# Patient Record
Sex: Male | Born: 1990 | Race: Black or African American | Hispanic: No | Marital: Single | Smoking: Current every day smoker
Health system: Southern US, Community
[De-identification: ages and names within clinical notes are randomized; demographics above are authoritative.]

---
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
## 2017-10-21 ENCOUNTER — Encounter: Payer: Self-pay | Admitting: Emergency Medicine

## 2017-10-21 ENCOUNTER — Other Ambulatory Visit: Payer: Self-pay

## 2017-10-21 ENCOUNTER — Emergency Department
Admission: EM | Admit: 2017-10-21 | Discharge: 2017-10-21 | Disposition: A | Discharge status: Home / Self Care | Payer: Self-pay | Attending: Emergency Medicine | Admitting: Emergency Medicine

## 2017-10-21 DIAGNOSIS — R220 Localized swelling, mass and lump, head: Secondary | ICD-10-CM | POA: Insufficient documentation

## 2017-10-21 DIAGNOSIS — K029 Dental caries, unspecified: Secondary | ICD-10-CM | POA: Insufficient documentation

## 2017-10-21 DIAGNOSIS — K047 Periapical abscess without sinus: Secondary | ICD-10-CM | POA: Insufficient documentation

## 2017-10-21 DIAGNOSIS — F17210 Nicotine dependence, cigarettes, uncomplicated: Secondary | ICD-10-CM | POA: Insufficient documentation

## 2017-10-21 MED ORDER — IBUPROFEN 800 MG PO TABS
800.0000 mg | ORAL_TABLET | Freq: Once | ORAL | Status: AC
  Administered 2017-10-21: 800 mg via ORAL
  Filled 2017-10-21: qty 1

## 2017-10-21 MED ORDER — LIDOCAINE VISCOUS 2 % MT SOLN
15.0000 mL | Freq: Once | OROMUCOSAL | Status: AC
  Administered 2017-10-21: 15 mL via OROMUCOSAL
  Filled 2017-10-21: qty 15

## 2017-10-21 MED ORDER — TRAMADOL HCL 50 MG PO TABS: 50.0000 mg | ORAL_TABLET | Freq: Four times a day (QID) | ORAL | 0 refills | Status: AC | PRN

## 2017-10-21 MED ORDER — TRAMADOL HCL 50 MG PO TABS
50.0000 mg | ORAL_TABLET | Freq: Once | ORAL | Status: AC
  Administered 2017-10-21: 50 mg via ORAL
  Filled 2017-10-21: qty 1

## 2017-10-21 MED ORDER — IBUPROFEN 600 MG PO TABS: 600.0000 mg | ORAL_TABLET | Freq: Four times a day (QID) | ORAL | 0 refills | Status: AC | PRN

## 2017-10-21 MED ORDER — AMOXICILLIN 500 MG PO CAPS: 500.0000 mg | ORAL_CAPSULE | Freq: Three times a day (TID) | ORAL | 0 refills | Status: AC

## 2017-10-21 NOTE — ED Notes (Signed)
Pt presents today for oral pain. Pt states jaw and face swelling. Slight swelling is noticed. Pt states pain 2 weeks ago off and on and swelling started yesterday. Pt denies fever n/v/d. Pt is NAD and awaiting EDP.

## 2017-10-21 NOTE — ED Notes (Signed)
First Nurse Note:  Facial swelling on left side related to toothache.

## 2017-10-21 NOTE — Discharge Instructions (Addendum)
Follow-up from list of dental clinics. °OPTIONS FOR DENTAL FOLLOW UP CARE ° °[place] [facility] and Human Services - Local Safety Net Dental Clinics °http://www.ncdhhs.gov/dph/oralhealth/services/safetynetclinics.htm °  °[facility] (336-562-3123) ° °[facility] (919-933-9087) ° °[facility] (919-663-1744 ext [phone]) ° °[facility] ([phone]) ° °[facility] ([phone]) °This clinic caters to the indigent population and is on a lottery system. °Location: °[facility], [address], [place] °Clinic Hours: °Wednesdays from 6pm - 9pm, patients seen by a lottery system. °For dates, call or go to www.med.[facility].edu/[facility]/patients/Dental-[facility] °Services: °Cleanings, fillings and simple extractions. °Payment Options: °DENTAL WORK IS FREE OF CHARGE. Bring proof of income or support. °Best way to get seen: °Arrive at 5:15 pm - this is a lottery, NOT first come/first serve, so arriving earlier will not increase your chances of being seen. °  °  °[facility] °[phone] °Select option 1 for emergencies °  °Location: °[facility], [address], [place] °Clinic Hours: °No walk-ins accepted - call the day before to schedule an appointment. °Check in times are 9:30 am and 1:30 pm. °Services: °Simple extractions, temporary fillings, pulpectomy/pulp debridement, uncomplicated abscess drainage. °Payment Options: °PAYMENT IS DUE AT THE TIME OF SERVICE.  Fee is usually $100-200, additional surgical procedures (e.g. abscess drainage) may be extra. °Cash, checks, Visa/MasterCard accepted.  Can file Medicaid if patient is covered for dental - patient should call case worker to check. °No discount for [facility] patients. °Best way to get seen: °MUST call the day before and get onto the schedule. Can usually be seen the next 1-2 days. No walk-ins accepted. °  °  °[facility] °[phone] °  °Location: °[facility], [address], [place] °Clinic Hours: °M, W, Th, F 8am or 1:30pm, Tues 9a or 1:30 - first come/first served. °Services: °Simple extractions, temporary fillings, uncomplicated abscess drainage.  You do not need to be an [place] resident. °Payment Options: °PAYMENT IS DUE AT THE TIME OF SERVICE. °Dental insurance, otherwise sliding scale - bring proof of income or support. °Depending on income and treatment needed, cost is usually $50-200. °Best way to get seen: °Arrive early as it is first come/first served. °  °  °[facility] °[phone] °  °Location: °[address] °Clinic Hours: °Mon-Thu 8a-5p °Services: °Most basic dental services including extractions and fillings. °Payment Options: °PAYMENT IS DUE AT THE TIME OF SERVICE. °Sliding scale, up to 50% off - bring proof if income or support. °Medicaid with dental option accepted. °Best way to get seen: °Call to schedule an appointment, can usually be seen within 2 weeks OR they will try to see walk-ins - show up at 8a or 2p (you may have to wait). °  °  °[facility] °[phone] °[place] RESIDENTS ONLY °  °Location: °[facility], [address], [place],  [address] °Clinic Hours: By appointment only. °Monday - Thursday 8am-5pm, Friday 8am-12pm °Services: Cleanings, fillings, extractions. °Payment Options: °PAYMENT IS DUE AT THE TIME OF SERVICE. °Cash, Visa or MasterCard. Sliding scale - $30 minimum per service. °Best way to get seen: °Come in to office, complete packet and make an appointment - need proof of income °or support monies for each household member and proof of [place] residence. °Usually takes about a month to get in. °  °  °[facility] °[phone] °  °  Location: °[address] [place] °Clinic Hours: Walk-in Urgent Care Dental Services  are offered Monday-Friday mornings only. °The numbers of emergencies accepted daily is limited to the number of °providers available. °Maximum 15 - Mondays, Wednesdays & Thursdays °Maximum 10 - Tuesdays & Fridays °Services: °You do not need to be a [place] resident to be seen for a dental emergency. °Emergencies are defined as pain, swelling, abnormal bleeding, or dental trauma. Walkins will receive x-rays if needed. °NOTE: Dental cleaning is not an emergency. °Payment Options: °PAYMENT IS DUE AT THE TIME OF SERVICE. °Minimum co-pay is $40.00 for uninsured patients. °Minimum co-pay is $3.00 for Medicaid with dental coverage. °Dental Insurance is accepted and must be presented at time of visit. °Medicare does not cover dental. °Forms of payment: Cash, credit card, checks. °Best way to get seen: °If not previously registered with the clinic, walk-in dental registration begins at 7:15 am and is on a first come/first serve basis. °If previously registered with the clinic, call to make an appointment. °  °  °[facility] °[phone] °[place] RESIDENTS ONLY °  °Location: °[address], [place], [place] °Clinic Hours: °Mon-Thu 10a-2p °Services: Extractions only! °Payment Options: °FREE (donations accepted) - bring proof of income or support °Best way to get seen: °Call and schedule an appointment OR come at 8am on the 1st Monday of every month (except for holidays) when it is first come/first served. °  °  °[facility] °[phone] °  °Location: °[address], [place] °Clinic Hours: °Friday mornings °Services, Payment Options, Best way to get seen: °Call for info ° °

## 2017-10-21 NOTE — ED Triage Notes (Signed)
C/O left lower jaw dental pain x 2 days.  Swelling to area x 1 day.

## 2017-10-21 NOTE — ED Provider Notes (Signed)
[facility] Emergency Department Provider Note   ____________________________________________   First MD Initiated Contact with Patient 10/21/17 1106     (approximate)  I have reviewed the triage vital signs and the nursing notes.   HISTORY  Chief Complaint Dental Pain    HPI Michael Morrow is a 26 y.o. male patient complaining of dental pain as swelling. Patient also has swelling to the left lateral jaw. Patient has a history of devitalized teeth without recent dental care.Patient denies fever associated this complaint. Patient rates his pain as 8/10. No palliative measures for complaint.   History reviewed. No pertinent past medical history.  There are no active problems to display for this patient.   History reviewed. No pertinent surgical history.  Prior to Admission medications   Medication Sig Start Date End Date Taking? Authorizing Provider  amoxicillin (AMOXIL) 500 MG capsule Take 1 capsule (500 mg total) by mouth 3 (three) times daily. 10/21/17   Joni ReiningSmith, Ronald K, PA-C  ibuprofen (ADVIL,MOTRIN) 600 MG tablet Take 1 tablet (600 mg total) by mouth every 6 (six) hours as needed. 10/21/17   Joni ReiningSmith, Ronald K, PA-C  traMADol (ULTRAM) 50 MG tablet Take 1 tablet (50 mg total) by mouth every 6 (six) hours as needed. 10/21/17 10/21/18  Joni ReiningSmith, Ronald K, PA-C    Allergies Patient has no known allergies.  No family history on file.  Social History Social History   Tobacco Use  . Smoking status: Current Every Day Smoker    Types: Cigarettes  . Smokeless tobacco: Never Used  Substance Use Topics  . Alcohol use: Not on file  . Drug use: Not on file    Review of Systems Constitutional: No fever/chills Eyes: No visual changes. ENT: No sore throat. Dental pain Cardiovascular: Denies chest pain. Respiratory: Denies shortness of breath. Gastrointestinal: No abdominal pain.  No nausea, no vomiting.  No diarrhea.  No constipation. Genitourinary:  Negative for dysuria. Musculoskeletal: Negative for back pain. Skin: Negative for rash. Neurological: Negative for headaches, focal weakness or numbness.   ____________________________________________   PHYSICAL EXAM:  VITAL SIGNS: ED Triage Vitals  Enc Vitals Group     BP 10/21/17 0954 130/70     Pulse Rate 10/21/17 0954 78     Resp 10/21/17 0954 16     Temp 10/21/17 0954 98.7 F (37.1 C)     Temp Source 10/21/17 0954 Oral     SpO2 10/21/17 0954 100 %     Weight 10/21/17 0953 152 lb (68.9 kg)     Height 10/21/17 0953 5\' 8"  (1.727 m)     Head Circumference --      Peak Flow --      Pain Score 10/21/17 0953 8     Pain Loc --      Pain Edu? --      Excl. in GC? --     Constitutional: Alert and oriented. Well appearing and in no acute distress. Mouth/Throat: Mucous membranes are moist.  Oropharynx non-erythematous. Gingivae edema and multiple caries and fractured of the left lower molars.  Hematological/Lymphatic/Immunilogical: No cervical lymphadenopathy. Cardiovascular: Normal rate, regular rhythm. Grossly normal heart sounds.  Good peripheral circulation. Respiratory: Normal respiratory effort.  No retractions. Lungs CTAB. Neurologic:  Normal speech and language. No gross focal neurologic deficits are appreciated. No gait instability. Skin:  Skin is warm, dry and intact. No rash noted. Psychiatric: Mood and affect are normal. Speech and behavior are normal.  ____________________________________________   LABS (all labs  ordered are listed, but only abnormal results are displayed)  Labs Reviewed - No data to display ____________________________________________  EKG   ____________________________________________  RADIOLOGY  No results found.  ____________________________________________   PROCEDURES  Procedure(s) performed: None  Procedures  Critical Care performed: No  ____________________________________________   INITIAL IMPRESSION / ASSESSMENT  AND PLAN / ED COURSE  As part of my medical decision making, I reviewed the following data within the electronic MEDICAL RECORD NUMBER    Dental pain and gingivitis secondary to multiple devitalized left molars. Patient given discharge Instructions. Patient advised follow-up for this to dental clinics provided. Patient advised to take medication as directed.      ____________________________________________   FINAL CLINICAL IMPRESSION(S) / ED DIAGNOSES  Final diagnoses:  Pain due to dental caries  Dental abscess     ED Discharge Orders        Ordered    amoxicillin (AMOXIL) 500 MG capsule  3 times daily     10/21/17 1120    traMADol (ULTRAM) 50 MG tablet  Every 6 hours PRN     10/21/17 1120    ibuprofen (ADVIL,MOTRIN) 600 MG tablet  Every 6 hours PRN     10/21/17 1120       Note:  This document was prepared using Dragon voice recognition software and may include unintentional dictation errors.    Joni ReiningSmith, Ronald K, PA-C 10/21/17 1122    Joni ReiningSmith, Ronald K, PA-C 10/21/17 1133    Willy Eddyobinson, Patrick, MD 10/21/17 [phone]

## 2018-09-15 ENCOUNTER — Encounter: Payer: Self-pay | Admitting: Emergency Medicine

## 2018-09-15 ENCOUNTER — Emergency Department
Admission: EM | Admit: 2018-09-15 | Discharge: 2018-09-15 | Disposition: A | Discharge status: Home / Self Care | Payer: No Typology Code available for payment source | Attending: Emergency Medicine | Admitting: Emergency Medicine

## 2018-09-15 ENCOUNTER — Other Ambulatory Visit: Payer: Self-pay

## 2018-09-15 ENCOUNTER — Emergency Department: Payer: No Typology Code available for payment source

## 2018-09-15 DIAGNOSIS — F17210 Nicotine dependence, cigarettes, uncomplicated: Secondary | ICD-10-CM | POA: Diagnosis not present

## 2018-09-15 DIAGNOSIS — Z79899 Other long term (current) drug therapy: Secondary | ICD-10-CM | POA: Insufficient documentation

## 2018-09-15 DIAGNOSIS — M25512 Pain in left shoulder: Secondary | ICD-10-CM | POA: Diagnosis present

## 2018-09-15 DIAGNOSIS — M545 Low back pain: Secondary | ICD-10-CM | POA: Insufficient documentation

## 2018-09-15 DIAGNOSIS — M7918 Myalgia, other site: Secondary | ICD-10-CM | POA: Diagnosis not present

## 2018-09-15 MED ORDER — CYCLOBENZAPRINE HCL 10 MG PO TABS
10.0000 mg | ORAL_TABLET | Freq: Once | ORAL | Status: AC
  Administered 2018-09-15: 10 mg via ORAL
  Filled 2018-09-15: qty 1

## 2018-09-15 MED ORDER — CYCLOBENZAPRINE HCL 10 MG PO TABS: 10.0000 mg | ORAL_TABLET | Freq: Three times a day (TID) | ORAL | 0 refills | Status: AC | PRN

## 2018-09-15 MED ORDER — TRAMADOL HCL 50 MG PO TABS
50.0000 mg | ORAL_TABLET | Freq: Once | ORAL | Status: AC
  Administered 2018-09-15: 50 mg via ORAL
  Filled 2018-09-15: qty 1

## 2018-09-15 MED ORDER — IBUPROFEN 600 MG PO TABS
600.0000 mg | ORAL_TABLET | Freq: Once | ORAL | Status: AC
  Administered 2018-09-15: 600 mg via ORAL
  Filled 2018-09-15: qty 1

## 2018-09-15 MED ORDER — IBUPROFEN 600 MG PO TABS: 600.0000 mg | ORAL_TABLET | Freq: Three times a day (TID) | ORAL | 0 refills | Status: AC | PRN

## 2018-09-15 MED ORDER — TRAMADOL HCL 50 MG PO TABS: 50.0000 mg | ORAL_TABLET | Freq: Two times a day (BID) | ORAL | 0 refills | Status: AC | PRN
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
